# Patient Record
Sex: Female | Born: 1990 | Race: White | Hispanic: No | Marital: Married | Smoking: Never smoker
Health system: Southern US, Community
[De-identification: ages and names within clinical notes are randomized; demographics above are authoritative.]

## PROBLEM LIST (undated history)

## (undated) DIAGNOSIS — Z789 Other specified health status: Secondary | ICD-10-CM

## (undated) DIAGNOSIS — E282 Polycystic ovarian syndrome: Secondary | ICD-10-CM

## (undated) HISTORY — PX: NO PAST SURGERIES: SHX2092

## (undated) HISTORY — DX: Polycystic ovarian syndrome: E28.2

## (undated) HISTORY — DX: Other specified health status: Z78.9

---

## 2020-01-27 ENCOUNTER — Ambulatory Visit: Payer: 59

## 2020-01-27 ENCOUNTER — Other Ambulatory Visit: Payer: Self-pay | Admitting: Obstetrics and Gynecology

## 2020-01-27 ENCOUNTER — Other Ambulatory Visit: Payer: Self-pay

## 2020-01-27 ENCOUNTER — Other Ambulatory Visit: Payer: Self-pay | Admitting: *Deleted

## 2020-01-27 ENCOUNTER — Ambulatory Visit: Payer: 59 | Attending: Obstetrics and Gynecology

## 2020-01-27 ENCOUNTER — Ambulatory Visit: Payer: 59 | Admitting: *Deleted

## 2020-01-27 VITALS — BP 133/91 | HR 121 | Ht 65.0 in

## 2020-01-27 DIAGNOSIS — O3622X2 Maternal care for hydrops fetalis, second trimester, fetus 2: Secondary | ICD-10-CM | POA: Diagnosis present

## 2020-01-27 DIAGNOSIS — Z363 Encounter for antenatal screening for malformations: Secondary | ICD-10-CM

## 2020-01-27 DIAGNOSIS — O321XX0 Maternal care for breech presentation, not applicable or unspecified: Secondary | ICD-10-CM | POA: Diagnosis not present

## 2020-01-27 DIAGNOSIS — O30032 Twin pregnancy, monochorionic/diamniotic, second trimester: Secondary | ICD-10-CM | POA: Diagnosis present

## 2020-01-27 DIAGNOSIS — O3110X0 Continuing pregnancy after spontaneous abortion of one fetus or more, unspecified trimester, not applicable or unspecified: Secondary | ICD-10-CM

## 2020-01-27 DIAGNOSIS — O30002 Twin pregnancy, unspecified number of placenta and unspecified number of amniotic sacs, second trimester: Secondary | ICD-10-CM | POA: Diagnosis present

## 2020-01-27 DIAGNOSIS — O30009 Twin pregnancy, unspecified number of placenta and unspecified number of amniotic sacs, unspecified trimester: Secondary | ICD-10-CM | POA: Diagnosis present

## 2020-01-27 DIAGNOSIS — O30039 Twin pregnancy, monochorionic/diamniotic, unspecified trimester: Secondary | ICD-10-CM | POA: Insufficient documentation

## 2020-01-27 DIAGNOSIS — O0990 Supervision of high risk pregnancy, unspecified, unspecified trimester: Secondary | ICD-10-CM | POA: Insufficient documentation

## 2020-01-27 DIAGNOSIS — O3622X0 Maternal care for hydrops fetalis, second trimester, not applicable or unspecified: Secondary | ICD-10-CM

## 2020-01-27 DIAGNOSIS — Z3A20 20 weeks gestation of pregnancy: Secondary | ICD-10-CM | POA: Diagnosis not present

## 2020-01-27 NOTE — Progress Notes (Unsigned)
Maternal-Fetal Medicine  Name: Dana Lloyd MRN: [id number] Referring Provider: Eula Flax, MD  Dana Lloyd, G1 P0 at 20w 6d gestation with monochorionic-diamniotic twin pregnancy is here for a second opinion ultrasound.  On your office scan yesterday, fetal demise of 1 twin and hydrops fetalis in the surviving twin was seen.  Fetal anatomy scan performed on 01/16/2020 at your office showed normal-appearing fetuses with normal anatomy.  Chorionicity was confirmed on early ultrasound at your office.  Patient does not give history of fever or rashes. She does not have any chronic medical conditions. Past surgical history: Nil of note. Social history: Denies tobacco or drug or alcohol use.  She is married and her husband is in good health. Obstetric history: Patient denies history of miscarriages. Patient's blood type: O positive.  Ultrasound: Twin A: Maternal left, breech presentation, anterior placenta.  Fetal biometry is consistent with her previously established dates.  Amniotic fluid is normal.  Unfortunately, fetal heart activity was absent.  Generalized fetal edema is seen. No gross fetal anomalies are seen. Twin B: Maternal right, cephalic presentation, anterior placenta.  Fetal biometry is consistent with the previously established dates.  Amniotic fluid is normal and good fetal activity seen.  Significant findings include: -Generalized skin edema. -Fetal ascites (subdiaphragmatic) and minimal pleural and pericardial effusions. -Fetal anatomy that could otherwise be ascertained appears normal. -Middle cerebral artery Doppler was performed to rule out anemia.  Doppler study showed normal peak systolic velocity measurements (no evidence of anemia. -Placenta appears normal and not enlarged. Impression: Monochorionic-diamniotic twin pregnancy with fetal demise of one twin and hydrops fetalis in the surviving twin. Our concerns include: Hydrops fetalis: -It is accumulation of excess fluid  in one or more fetal compartments. -It has several causes including fetal infections, chromosomal anomalies, congenital malformations (including cardiac), twin-to-twin transfusion syndrome, fetal tumors, fetal anemia. -Exact cause may be difficult to determine in some cases; exclusion of some conditions are possible. -Twin-to-transfusion syndrome (TTTS) is unlikely given that normal amniotic fluid is seen in both sacs. Ultrasound performed at your office did not show evidence of TTTS. -Fetal infection is possible and I discussed screening for Parvovirus B 19, CMV and Toxoplasmosis infections. Amniotic fluid can also be analyzed for CMV and Toxoplasmosis PCR. -Early hydrops is associated with more-commonly with fetal chromosomal anomalies. I recommended amniocentesis to rule out fetal karyotype. Microarray analysis can be performed to rule out some (not all) genetic conditions.  -We will set up appointments for fetal echocardiography in future to rule out congenital heart malformations. -No obvious chest mass or tumors are seen. -Normal MCA Doppler study is reassuring, and fetal anemia is unlikely (more common with Parvovirus B19 infection). However, we will repeat MCA Doppler studies next week. -Metabolic or genetic causes are possible and amniocentesis may be able to determine some of these disorders. -Placenta appears normal (no evidence of chorioangioma that can lead to hydrops).  I informed the couple that hydrops fetalis is associated with a very high fetal mortality and is related to its cause.   Monochorionic-diamniotic twin pregnancy with single twin demise: -Fetal death of one twin in monochorionic-diamniotic twin can lead to profound hypotension in the surviving fetus. -I informed the couple that there was no evidence of overt TTTS or fetal growth restriction or fetal anomalies (twin A). -Neurological injuries follow in the surviving twin because of hypoxia (ischemic changes) or  hemorrhage. -In about 15% (1 in 6) of the cases, the co-twin dies and in about 25% (1 in 4), the  surviving twin has severe neurological complications. They can be poor neurodevelopmental outcome, seizures or cerebral palsy. These complications are more frequent if fetal death occurs in the third trimester. -Fetal brain MRI after few weeks may show signs of neurological lesions.  After counseling, the patient opted to have blood drawn to screen for infections. She would like to wait for a week and may have amniocentesis at her next visit. She understands that no intervention is possible now and there is a high likelihood of fetal death of surviving twin. Patient informed that termination of pregnancy is not an option and will continue her pregnancy regardless of fetal outcome.  Recommendations: -Blood was drawn for Parvovirus IgG/IgM, CMV IgG/IgM, Toxoplasmosis IgG/IgM. -Patient was counseled that CMV IgG avidity (if IgM is positive) may be necessary and toxoplasmosis screening may be performed at a reference lab if IgM is positive. -Appointment was made for her to return next week to evaluate the fetus and for possible amniocentesis. Thank you for ultrasound. Consultation including face-to-face counseling: 45 minutes.

## 2020-01-28 LAB — CMV ANTIBODY, IGG (EIA): CMV Ab - IgG: <0.6 U/mL (ref 0.00–0.59)

## 2020-01-28 LAB — PARVOVIRUS B19 ANTIBODY, IGG AND IGM
Parvovirus B19 IgG: 5.8 index — ABNORMAL HIGH (ref 0.0–0.8)
Parvovirus B19 IgM: 0.1 index (ref 0.0–0.8)

## 2020-01-28 LAB — TOXOPLASMA GONDII ANTIBODY, IGG: Toxoplasma IgG Ratio: <3 IU/mL (ref 0.0–7.1)

## 2020-01-28 LAB — INFECT DISEASE AB IGM REFLEX 1

## 2020-01-28 LAB — TOXOPLASMA GONDII ANTIBODY, IGM: Toxoplasma Antibody- IgM: <3 AU/mL (ref 0.0–7.9)

## 2020-01-28 LAB — CMV IGM: CMV IgM Ser EIA-aCnc: <30 AU/mL (ref 0.0–29.9)

## 2020-01-29 ENCOUNTER — Telehealth: Payer: Self-pay | Admitting: Obstetrics and Gynecology

## 2020-01-29 NOTE — Telephone Encounter (Signed)
Called patient and informed her of results. CMV, Toxo, and Parvovirus B19 screening all negative and no signs of active infection. Reassured the patient.

## 2020-02-03 ENCOUNTER — Other Ambulatory Visit: Payer: Self-pay | Admitting: Obstetrics and Gynecology

## 2020-02-04 ENCOUNTER — Ambulatory Visit: Payer: 59

## 2020-02-04 ENCOUNTER — Ambulatory Visit: Payer: 59 | Admitting: *Deleted

## 2020-02-04 ENCOUNTER — Other Ambulatory Visit: Payer: Self-pay | Admitting: *Deleted

## 2020-02-04 ENCOUNTER — Ambulatory Visit: Payer: 59 | Attending: Obstetrics and Gynecology

## 2020-02-04 ENCOUNTER — Other Ambulatory Visit: Payer: Self-pay

## 2020-02-04 VITALS — BP 127/89 | HR 104

## 2020-02-04 DIAGNOSIS — O30032 Twin pregnancy, monochorionic/diamniotic, second trimester: Secondary | ICD-10-CM

## 2020-02-04 DIAGNOSIS — O3112X0 Continuing pregnancy after spontaneous abortion of one fetus or more, second trimester, not applicable or unspecified: Secondary | ICD-10-CM | POA: Diagnosis not present

## 2020-02-04 DIAGNOSIS — Z3A22 22 weeks gestation of pregnancy: Secondary | ICD-10-CM

## 2020-02-04 DIAGNOSIS — O30002 Twin pregnancy, unspecified number of placenta and unspecified number of amniotic sacs, second trimester: Secondary | ICD-10-CM

## 2020-02-04 DIAGNOSIS — O3622X0 Maternal care for hydrops fetalis, second trimester, not applicable or unspecified: Secondary | ICD-10-CM

## 2020-02-04 DIAGNOSIS — O0990 Supervision of high risk pregnancy, unspecified, unspecified trimester: Secondary | ICD-10-CM | POA: Insufficient documentation

## 2020-02-04 DIAGNOSIS — IMO0002 Reserved for concepts with insufficient information to code with codable children: Secondary | ICD-10-CM

## 2020-02-13 ENCOUNTER — Ambulatory Visit: Payer: 59 | Attending: Obstetrics and Gynecology

## 2020-02-13 ENCOUNTER — Other Ambulatory Visit: Payer: Self-pay

## 2020-02-13 ENCOUNTER — Ambulatory Visit: Payer: 59 | Admitting: *Deleted

## 2020-02-13 VITALS — BP 127/83 | HR 98

## 2020-02-13 DIAGNOSIS — IMO0002 Reserved for concepts with insufficient information to code with codable children: Secondary | ICD-10-CM

## 2020-02-13 DIAGNOSIS — O0990 Supervision of high risk pregnancy, unspecified, unspecified trimester: Secondary | ICD-10-CM

## 2020-02-13 DIAGNOSIS — O3506X0 Maternal care for (suspected) central nervous system malformation or damage in fetus, hydrocephaly, not applicable or unspecified: Secondary | ICD-10-CM

## 2020-02-13 DIAGNOSIS — O3112X0 Continuing pregnancy after spontaneous abortion of one fetus or more, second trimester, not applicable or unspecified: Secondary | ICD-10-CM | POA: Diagnosis not present

## 2020-02-13 DIAGNOSIS — O30032 Twin pregnancy, monochorionic/diamniotic, second trimester: Secondary | ICD-10-CM

## 2020-02-13 DIAGNOSIS — O3622X0 Maternal care for hydrops fetalis, second trimester, not applicable or unspecified: Secondary | ICD-10-CM

## 2020-02-13 DIAGNOSIS — Z3A23 23 weeks gestation of pregnancy: Secondary | ICD-10-CM | POA: Diagnosis not present

## 2020-02-13 DIAGNOSIS — O350XX0 Maternal care for (suspected) central nervous system malformation in fetus, not applicable or unspecified: Secondary | ICD-10-CM | POA: Insufficient documentation

## 2020-02-17 ENCOUNTER — Other Ambulatory Visit: Payer: Self-pay

## 2020-02-17 ENCOUNTER — Telehealth: Payer: Self-pay | Admitting: Genetic Counselor

## 2020-02-17 NOTE — Telephone Encounter (Addendum)
I called Dana Lloyd to discuss results from Dana Lloyd Panorama noninvasive prenatal screening (NIPS). Dana Lloyd had Panorama NIPS performed to assess for chromosomal aneuploidies given ventriculomegaly in the fetus remaining from Dana Lloyd twin pregnancy. We reviewed that these results showed a less than 1 in 10,000 risk for trisomies 21, 18 and 13. In addition, the risk for triploidy was also low. However, an atypical finding on the sex chromosomes was identified. The laboratory was unable to determine if this finding was suspected to be maternal or fetal (placental) in nature. A genetic counselor from [place] informed me that this change is suspected to involve the X chromosome.  We reviewed that NIPS analyzes cell-free fetal DNA from the placenta found in the maternal bloodstream during pregnancy. Based on this, we discussed that there are several possibilities that could warrant Dana Lloyd atypical NIPS result. One possibility is the fetus having a genetic change involving the sex chromosomes. Another possibility is Dana Lloyd herself having a genetic change involving Dana Lloyd sex chromosomes. A second possibility is fetal or maternal mosaicism for a change involving the sex chromosomes. Mosaicism occurs when not every cell in the body is genetically identical; some cells in the body may have a genetic change involving the sex chromosomes, whereas others may have normal sex chromosomes. A third possibility is that of confined placental mosaicism, or a result representative of the placenta only rather than the fetus. Finally, it is possible that this is a false positive result. Since the twins were monochorionic-diamniotic, they would have been monozygotic; thus, if this finding is truly fetal in origin, it would likely be representative of both twins. A false positive result representing only the demised twin is unlikely.  We reviewed available testing options to attempt to get more information to clarify this finding.  Firstly, Ms.  Lloyd was informed that diagnostic testing via amniocentesis would be the only way to determine if the fetus has a chromosomal abnormality involving the sex chromosomes prenatally. She was also made aware that she has the option to wait to pursue genetic testing postnatally if desired. Secondly, we could consider pursuing a maternal karyotype to determine if Dana Lloyd herself has a genetic change involving Dana Lloyd sex chromosomes. Dana Lloyd had previously discussed the option of amniocentesis with Dana Lloyd and indicated that she was not interested in pursuing this option. The risks associated with this procedure are anxiety-producing to Dana Lloyd, especially given that she has already had the demise of one twin during this pregnancy. She would like to pursue testing postnatally. Dana Lloyd is also interested in undergoing karyotype analysis for herself if Dana Lloyd insurance company will cover it. We made a plan for me to email Dana Lloyd the CPT billing codes associated with karyotype. She can call Dana Lloyd insurance provider with these codes to determine whether they will cover the cost of this testing.   Dana Lloyd had excellent questions regarding what the implications would be if the fetus does have a genetic change involving his sex chromosomes. We discussed that without knowing the precise reason for this abnormal NIPS result, we are limited in understanding if there may be any potential health or developmental effects for the fetus. If the fetus were to have a chromosomal abnormality, the size, location, and type of chromosomal change would all be important in determining possible effects on health/development. We discussed that some chromosomal changes may have implications for health/development, such as impacts on puberty and/or learning, whereas other chromosomal changes may be benign familial findings. If postnatal testing  were to confirm an abnormal chromosomal finding, Dana Lloyd would be informed about which, if any, features  she could expect in Dana Lloyd son as a result.  Dana Lloyd will contact me once she has determined if Dana Lloyd insurance provider will cover the cost of karyotype analysis for herself. I will help to facilitate testing from there if still desired. She confirmed that she had no further questions at this time.  Gershon Crane, MS, [facility] Genetic Counselor

## 2020-02-20 ENCOUNTER — Ambulatory Visit: Payer: 59

## 2020-02-27 ENCOUNTER — Ambulatory Visit: Payer: 59

## 2020-02-27 ENCOUNTER — Other Ambulatory Visit: Payer: Self-pay

## 2020-02-27 ENCOUNTER — Ambulatory Visit: Payer: 59 | Attending: Obstetrics and Gynecology

## 2020-02-27 ENCOUNTER — Ambulatory Visit: Payer: 59 | Admitting: *Deleted

## 2020-02-27 VITALS — BP 134/90 | HR 106

## 2020-02-27 DIAGNOSIS — O3622X0 Maternal care for hydrops fetalis, second trimester, not applicable or unspecified: Secondary | ICD-10-CM | POA: Diagnosis not present

## 2020-02-27 DIAGNOSIS — O285 Abnormal chromosomal and genetic finding on antenatal screening of mother: Secondary | ICD-10-CM

## 2020-02-27 DIAGNOSIS — O3122X0 Continuing pregnancy after intrauterine death of one fetus or more, second trimester, not applicable or unspecified: Secondary | ICD-10-CM

## 2020-02-27 DIAGNOSIS — O30032 Twin pregnancy, monochorionic/diamniotic, second trimester: Secondary | ICD-10-CM | POA: Diagnosis not present

## 2020-02-27 DIAGNOSIS — Z3A25 25 weeks gestation of pregnancy: Secondary | ICD-10-CM

## 2020-02-27 DIAGNOSIS — O350XX0 Maternal care for (suspected) central nervous system malformation in fetus, not applicable or unspecified: Secondary | ICD-10-CM | POA: Diagnosis not present

## 2020-02-27 DIAGNOSIS — IMO0002 Reserved for concepts with insufficient information to code with codable children: Secondary | ICD-10-CM

## 2020-02-27 DIAGNOSIS — O0990 Supervision of high risk pregnancy, unspecified, unspecified trimester: Secondary | ICD-10-CM

## 2020-02-27 DIAGNOSIS — O30002 Twin pregnancy, unspecified number of placenta and unspecified number of amniotic sacs, second trimester: Secondary | ICD-10-CM | POA: Diagnosis not present

## 2020-03-01 ENCOUNTER — Other Ambulatory Visit: Payer: Self-pay | Admitting: *Deleted

## 2020-03-05 ENCOUNTER — Ambulatory Visit: Payer: 59

## 2020-03-08 ENCOUNTER — Encounter: Payer: Self-pay | Admitting: Pediatric Cardiology

## 2020-03-11 ENCOUNTER — Telehealth: Payer: Self-pay | Admitting: Genetic Counselor

## 2020-03-11 LAB — CHROMOSOME, BLOOD, ROUTINE
Cells Analyzed: 20
Cells Counted: 20
Cells Karyotyped: 2
GTG Band Resolution Achieved: 500

## 2020-03-11 NOTE — Telephone Encounter (Signed)
LVM for Dana Lloyd informing her that I have results from her karyotype. Requested a call back to discuss these in detail.  Gershon Crane, MS, [facility] Genetic Counselor

## 2020-03-12 ENCOUNTER — Ambulatory Visit: Payer: Self-pay | Admitting: Genetic Counselor

## 2020-03-12 ENCOUNTER — Other Ambulatory Visit: Payer: Self-pay

## 2020-03-12 ENCOUNTER — Ambulatory Visit: Payer: 59 | Attending: Obstetrics and Gynecology

## 2020-03-12 ENCOUNTER — Ambulatory Visit: Payer: 59 | Admitting: *Deleted

## 2020-03-12 VITALS — BP 127/81 | HR 101

## 2020-03-12 DIAGNOSIS — Z315 Encounter for genetic counseling: Secondary | ICD-10-CM

## 2020-03-12 DIAGNOSIS — Z3A27 27 weeks gestation of pregnancy: Secondary | ICD-10-CM

## 2020-03-12 DIAGNOSIS — O3622X0 Maternal care for hydrops fetalis, second trimester, not applicable or unspecified: Secondary | ICD-10-CM

## 2020-03-12 DIAGNOSIS — O0990 Supervision of high risk pregnancy, unspecified, unspecified trimester: Secondary | ICD-10-CM | POA: Diagnosis present

## 2020-03-12 DIAGNOSIS — O30032 Twin pregnancy, monochorionic/diamniotic, second trimester: Secondary | ICD-10-CM | POA: Diagnosis not present

## 2020-03-12 DIAGNOSIS — O30002 Twin pregnancy, unspecified number of placenta and unspecified number of amniotic sacs, second trimester: Secondary | ICD-10-CM

## 2020-03-12 DIAGNOSIS — O3112X0 Continuing pregnancy after spontaneous abortion of one fetus or more, second trimester, not applicable or unspecified: Secondary | ICD-10-CM

## 2020-03-12 DIAGNOSIS — O350XX0 Maternal care for (suspected) central nervous system malformation in fetus, not applicable or unspecified: Secondary | ICD-10-CM | POA: Insufficient documentation

## 2020-03-12 DIAGNOSIS — IMO0002 Reserved for concepts with insufficient information to code with codable children: Secondary | ICD-10-CM

## 2020-03-12 NOTE — Progress Notes (Signed)
I briefly met with Dana Lloyd and her husband today to review results from her karyotype. Dana Lloyd underwent karyotype analysis following noninvasive prenatal screening (NIPS) results that were high-risk for an atypical finding on the sex chromosomes. Dana Lloyd karyotype was normal (46,XX). This rules out some maternal sex chromosome abnormalities, such as mosaic Turner syndrome. However, these results cannot rule out a smaller maternal sex chromosome abnormality that would not be detectable via karyotype, such as a microdeletion or microduplication.  We again reviewed additional testing options available to further explore the etiology of Dana Lloyd's abnormal NIPS finding. Dana Lloyd confirmed that she is still not interested in undergoing amniocentesis to definitively determine if the fetus has a sex chromosome abnormality. We discussed that there is additional testing available to test for other maternal chromosome abnormalities, such as a chromosomal microarray. However, the utility of this testing may be limited, as it is possible that a finding may not provide information about Dana Lloyd's health or the health of the fetus. Dana Lloyd declined further testing. She was informed that postnatal testing is an option if clinically indicated.   Dana Lloyd and her husband confirmed that they had no further questions at this time. I encouraged her to contact me if she has any questions or concerns in the future.  Dana Manis, MS, [facility] Genetic Counselor

## 2020-03-19 ENCOUNTER — Telehealth (HOSPITAL_COMMUNITY): Payer: Self-pay | Admitting: Obstetrics and Gynecology

## 2020-03-19 NOTE — Telephone Encounter (Signed)
I called and spoke with Dana Lloyd. Discussed fetal brain MRI results. MRI showed loss of brain volume suggesting vascular injury.  Earlier, I received a call from Dr. Kirby Funk, MFM at [facility] to discuss results (MRI ordered by her).  I discussed with the patient the option of delivery at [facility] (recommended) to have pediatric neurology support after delivery.  Patient will discuss with her husband and decide and communicate on Monday.  If she agrees to deliver at [facility], we will set up an appointment with MFM, [facility].  I discussed with Dr. Ellyn Hack.

## 2020-03-29 ENCOUNTER — Other Ambulatory Visit: Payer: Self-pay

## 2020-03-29 ENCOUNTER — Ambulatory Visit: Payer: 59 | Admitting: *Deleted

## 2020-03-29 ENCOUNTER — Ambulatory Visit: Payer: 59 | Attending: Obstetrics and Gynecology

## 2020-03-29 ENCOUNTER — Other Ambulatory Visit: Payer: Self-pay | Admitting: *Deleted

## 2020-03-29 VITALS — BP 132/87 | HR 96

## 2020-03-29 DIAGNOSIS — Z362 Encounter for other antenatal screening follow-up: Secondary | ICD-10-CM

## 2020-03-29 DIAGNOSIS — O30032 Twin pregnancy, monochorionic/diamniotic, second trimester: Secondary | ICD-10-CM

## 2020-03-29 DIAGNOSIS — O0990 Supervision of high risk pregnancy, unspecified, unspecified trimester: Secondary | ICD-10-CM | POA: Insufficient documentation

## 2020-03-29 DIAGNOSIS — Z3A29 29 weeks gestation of pregnancy: Secondary | ICD-10-CM

## 2020-03-29 DIAGNOSIS — O359XX0 Maternal care for (suspected) fetal abnormality and damage, unspecified, not applicable or unspecified: Secondary | ICD-10-CM

## 2020-03-29 DIAGNOSIS — O350XX0 Maternal care for (suspected) central nervous system malformation in fetus, not applicable or unspecified: Secondary | ICD-10-CM

## 2020-03-29 DIAGNOSIS — O3622X0 Maternal care for hydrops fetalis, second trimester, not applicable or unspecified: Secondary | ICD-10-CM

## 2020-03-29 DIAGNOSIS — O3112X0 Continuing pregnancy after spontaneous abortion of one fetus or more, second trimester, not applicable or unspecified: Secondary | ICD-10-CM

## 2020-03-29 DIAGNOSIS — O30002 Twin pregnancy, unspecified number of placenta and unspecified number of amniotic sacs, second trimester: Secondary | ICD-10-CM

## 2020-04-12 ENCOUNTER — Ambulatory Visit: Payer: 59

## 2020-04-13 ENCOUNTER — Other Ambulatory Visit: Payer: Self-pay

## 2021-10-03 ENCOUNTER — Other Ambulatory Visit: Payer: Self-pay

## 2021-10-03 ENCOUNTER — Ambulatory Visit: Payer: 59 | Admitting: Neurology

## 2021-10-03 ENCOUNTER — Encounter: Payer: Self-pay | Admitting: Neurology

## 2021-10-03 VITALS — BP 118/80 | HR 91 | Ht 65.0 in | Wt 195.0 lb

## 2021-10-03 DIAGNOSIS — H4710 Unspecified papilledema: Secondary | ICD-10-CM

## 2021-10-03 DIAGNOSIS — G4489 Other headache syndrome: Secondary | ICD-10-CM

## 2021-10-03 NOTE — Progress Notes (Signed)
[facility]  PATIENT: Dana Lloyd DOB: October 14, 1990  REFERRING DOCTOR OR PCP: Daisy Lazar, DO SOURCE: Patient, notes from Dr. Charise Killian  _________________________________   HISTORICAL  CHIEF COMPLAINT:  Chief Complaint  Patient presents with   New Patient (Initial Visit)    RM 1. Paper referral for optic disc edema. Pt has intermittent headache (not daily)    HISTORY OF PRESENT ILLNESS: I had the pleasure of seeing your patient, Dana Lloyd, at [facility] for neurologic consultation regarding her bilateral optic disc edema.  She is a 31 year old woman who was found on routine optometric evaluation to have bilateral disc edema.  She has a recent optometric examination Marilu Favre, OD) from 09/10/2021.  The purpose of the visit was for blurred vision at a distance.  Vision was correctable to 20/20 and contact lenses were prescribed..  Pupils were normal.  Extraocular muscles were normal.  Slit-lamp examination was normal.  Funduscopic examination showed bilateral disc edema.  It was otherwise normal.   Her previous eye exam was about a year earlier.      She experiences headaches lasting 1-3 hours about every other day.   She does not have tinnitus.  No diplopia or change in vision with position.  No visual changes (better with new glasses).      Weight is stable over last year.  Her diet is normal.   She denies significant snoring.  She does not have excessive daytime sleepiness.  She has not had any imaging studies of the brain.  She delivered a son  05/20/2020.  She had an epidural for anesthesia but did have a CSF leak.   She did have a severe headache afterwards that did not change with positions.  She was treated with medications for the headache and conservative measures.  REVIEW OF SYSTEMS: Constitutional: No fevers, chills, sweats, or change in appetite Eyes: As above.   Ear, nose and throat: No hearing loss, ear pain, nasal congestion,  sore throat Cardiovascular: No chest pain, palpitations Respiratory:  No shortness of breath at rest or with exertion.   No wheezes GastrointestinaI: No nausea, vomiting, diarrhea, abdominal pain, fecal incontinence Genitourinary:  No dysuria, urinary retention or frequency.  No nocturia. Musculoskeletal:  No neck pain, back pain Integumentary: No rash, pruritus, skin lesions Neurological: as above Psychiatric: No depression at this time.  No anxiety Endocrine: No palpitations, diaphoresis, change in appetite, change in weigh or increased thirst Hematologic/Lymphatic:  No anemia, purpura, petechiae. Allergic/Immunologic: No itchy/runny eyes, nasal congestion, recent allergic reactions, rashes  ALLERGIES: No Known Allergies  HOME MEDICATIONS:  Current Outpatient Medications:    norethindrone (MICRONOR) 0.35 MG tablet, Take 1 tablet by mouth daily., Disp: , Rfl:    sertraline (ZOLOFT) 50 MG tablet, Take 50 mg by mouth daily., Disp: , Rfl:   PAST MEDICAL HISTORY: Past Medical History:  Diagnosis Date   Medical history non-contributory     PAST SURGICAL HISTORY: Past Surgical History:  Procedure Laterality Date   NO PAST SURGERIES      FAMILY HISTORY: History reviewed. No pertinent family history.  SOCIAL HISTORY:  Social History   Socioeconomic History   Marital status: Married    Spouse name: Not on file   Number of children: Not on file   Years of education: Not on file   Highest education level: Not on file  Occupational History   Not on file  Tobacco Use   Smoking status: Never   Smokeless tobacco: Never  Vaping  Use   Vaping Use: Never used  Substance and Sexual Activity   Alcohol use: Not Currently   Drug use: Never   Sexual activity: Not on file  Other Topics Concern   Not on file  Social History Narrative   Right handed   Coffee every other day   Social Determinants of Health   Financial Resource Strain: Not on file  Food Insecurity: Not on file   Transportation Needs: Not on file  Physical Activity: Not on file  Stress: Not on file  Social Connections: Not on file  Intimate Partner Violence: Not on file     PHYSICAL EXAM  Vitals:   10/03/21 0842  BP: 118/80  Height: 5\' 5"  (1.651 m)    There is no height or weight on file to calculate BMI.   General: The patient is well-developed and well-nourished and in no acute distress  HEENT:  Head is [place]/AT.  Sclera are anicteric.  Funduscopic exam shows optic nerve edema and normal retinal vessels.  Neck: No carotid bruits are noted.  The neck is nontender.  Cardiovascular: The heart has a regular rate and rhythm with a normal S1 and S2. There were no murmurs, gallops or rubs.    Skin: Extremities are without rash or  edema.  Musculoskeletal:  Back is nontender  Neurologic Exam  Mental status: The patient is alert and oriented x 3 at the time of the examination. The patient has apparent normal recent and remote memory, with an apparently normal attention span and concentration ability.   Speech is normal.  Cranial nerves: Extraocular movements are full. Pupils are equal, round, and reactive to light and accomodation.  Visual fields are full.  Facial symmetry is present. There is good facial sensation to soft touch bilaterally.Facial strength is normal.  Trapezius and sternocleidomastoid strength is normal. No dysarthria is noted.  The tongue is midline, and the patient has symmetric elevation of the soft palate. No obvious hearing deficits are noted.  Motor:  Muscle bulk is normal.   Tone is normal. Strength is  5 / 5 in all 4 extremities.   Sensory: Sensory testing is intact to pinprick, soft touch and vibration sensation in all 4 extremities.  Coordination: Cerebellar testing reveals good finger-nose-finger and heel-to-shin bilaterally.  Gait and station: Station is normal.   Gait is normal. Tandem gait is normal. Romberg is negative.   Reflexes: Deep tendon reflexes are  symmetric and normal in arms, 3 at knees and 2 at ankles..   No ankle clonus.    ASSESSMENT AND PLAN  Optic nerve edema - Plan: MR BRAIN W WO CONTRAST  Other headache syndrome - Plan: MR BRAIN W WO CONTRAST  In summary, Ms. Manson PasseyBrown is a 31 year old woman with bilateral optic nerve edema noted on routine optometric evaluation last month.  She does have some headaches that are nonspecific.  Of note, she did have a spinal fluid leak September 2021 after an epidural for childbirth and had headaches for several weeks afterwards that did not have a clear positional element.  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  However, we will need to check an MRI of the brain to make sure that there is not a pathologic explanation for her symptoms.  If the MRI is normal or near normal we will have her get a lumbar puncture to measure the opening pressure.  If elevated we will start Diamox.  Her main risk factor for IIH is elevated  BMI (32).  She will return to see me in 3 months or sooner if there are new or worsening neurologic symptoms or based on results of studies.  Thank you for asking to see Ms. Manson Passey.  Please let me know if I can be of further assistance with her or other patients in the future.   Laniesha Das A. Epimenio Foot, MD, [facility] 10/03/2021, 8:49 AM Certified in Neurology, Clinical Neurophysiology, Sleep Medicine and Neuroimaging  [facility] [address], [address] [place], [place] [address] [phone]

## 2021-10-04 ENCOUNTER — Other Ambulatory Visit: Payer: Self-pay | Admitting: Neurology

## 2021-10-04 NOTE — Telephone Encounter (Signed)
spoke to the patient due to the cost she will get back to me  [facility] auth: [id number] (exp. 10/04/21 to 11/18/21)

## 2021-10-05 MED ORDER — ALPRAZOLAM 0.5 MG PO TABS: 0.5000 mg | ORAL_TABLET | Freq: Once | ORAL | 0 refills | Status: DC | PRN

## 2021-10-05 NOTE — Addendum Note (Signed)
Addended by: Darleen Crocker on: 10/05/2021 02:22 PM   Modules accepted: Orders

## 2021-10-05 NOTE — Telephone Encounter (Signed)
Patient called back and wanted to schedule her MRI. She is scheduled at [facility] for 10/11/21.  She also informed me she is claustrophobic and would like something to help her. She is aware to have a driver.

## 2021-10-11 ENCOUNTER — Other Ambulatory Visit: Payer: Self-pay | Admitting: Neurology

## 2021-10-11 ENCOUNTER — Ambulatory Visit: Payer: 59

## 2021-10-11 DIAGNOSIS — G4489 Other headache syndrome: Secondary | ICD-10-CM | POA: Diagnosis not present

## 2021-10-11 DIAGNOSIS — H4710 Unspecified papilledema: Secondary | ICD-10-CM

## 2021-10-11 MED ORDER — GADOBENATE DIMEGLUMINE 529 MG/ML IV SOLN
15.0000 mL | Freq: Once | INTRAVENOUS | Status: AC | PRN
  Administered 2021-10-11: 15 mL via INTRAVENOUS

## 2021-10-12 ENCOUNTER — Encounter: Payer: Self-pay | Admitting: Neurology

## 2021-10-15 ENCOUNTER — Encounter: Payer: Self-pay | Admitting: Neurology

## 2021-10-20 ENCOUNTER — Telehealth: Payer: Self-pay | Admitting: *Deleted

## 2021-10-20 ENCOUNTER — Telehealth: Payer: Self-pay | Admitting: Neurology

## 2021-10-20 ENCOUNTER — Ambulatory Visit
Admission: RE | Admit: 2021-10-20 | Discharge: 2021-10-20 | Disposition: A | Discharge status: Home / Self Care | Payer: 59 | Source: Ambulatory Visit | Attending: Neurology | Admitting: Neurology

## 2021-10-20 ENCOUNTER — Other Ambulatory Visit: Payer: Self-pay | Admitting: Neurology

## 2021-10-20 VITALS — BP 109/67 | HR 96

## 2021-10-20 DIAGNOSIS — H4710 Unspecified papilledema: Secondary | ICD-10-CM

## 2021-10-20 DIAGNOSIS — G4489 Other headache syndrome: Secondary | ICD-10-CM

## 2021-10-20 NOTE — Telephone Encounter (Signed)
-----   Message from Asa Lente, MD sent at 10/20/2021  3:35 PM EST ----- Please let her know that the lumbar puncture showed that the spinal fluid pressure was actually normal.  Therefore, I do not have an explanation for the changes in the optic nerves.  I would like her to see a neuro-ophthalmologist (Dr. Gentry Roch or one of his colleagues at [facility]) as there to get their input into what might be causing this.

## 2021-10-20 NOTE — Telephone Encounter (Signed)
Referral sent to [facility] (Dr. Gentry Roch) [phone].

## 2021-10-20 NOTE — Discharge Instructions (Signed)

## 2021-10-25 LAB — VDRL, CSF: VDRL Quant, CSF: NONREACTIVE

## 2021-10-25 LAB — CSF CELL COUNT WITH DIFFERENTIAL
RBC Count, CSF: 0 cells/uL
WBC, CSF: 0 cells/uL (ref 0–5)

## 2021-10-25 LAB — GLUCOSE, CSF: Glucose, CSF: 58 mg/dL (ref 40–80)

## 2021-10-25 LAB — PROTEIN, CSF: Total Protein, CSF: 34 mg/dL (ref 15–45)

## 2021-11-02 ENCOUNTER — Other Ambulatory Visit: Payer: Self-pay | Admitting: *Deleted

## 2021-11-02 DIAGNOSIS — H4710 Unspecified papilledema: Secondary | ICD-10-CM

## 2021-11-02 DIAGNOSIS — G4489 Other headache syndrome: Secondary | ICD-10-CM

## 2021-11-02 NOTE — Telephone Encounter (Signed)
Sent via their [facility].

## 2021-11-03 NOTE — Telephone Encounter (Signed)
Sent to Neuro-ophthalmology at [facility].  Ph # [phone]

## 2021-11-18 ENCOUNTER — Other Ambulatory Visit: Payer: Self-pay

## 2021-11-18 ENCOUNTER — Encounter: Payer: Self-pay | Admitting: Nurse Practitioner

## 2021-11-18 ENCOUNTER — Ambulatory Visit: Payer: 59 | Admitting: Nurse Practitioner

## 2021-11-18 VITALS — BP 132/85 | HR 99 | Temp 98.6°F | Ht 65.7 in | Wt 200.0 lb

## 2021-11-18 DIAGNOSIS — Z7689 Persons encountering health services in other specified circumstances: Secondary | ICD-10-CM

## 2021-11-18 DIAGNOSIS — H4710 Unspecified papilledema: Secondary | ICD-10-CM

## 2021-11-18 DIAGNOSIS — F339 Major depressive disorder, recurrent, unspecified: Secondary | ICD-10-CM

## 2021-11-18 MED ORDER — SERTRALINE HCL 50 MG PO TABS: 50.0000 mg | ORAL_TABLET | Freq: Every day | ORAL | 1 refills | Status: AC

## 2021-11-18 NOTE — Assessment & Plan Note (Signed)
Chronic. Ongoing.  Found on routine eye exam.  Followed by Dr. Epimenio Foot Neurology.  Continue to follow per their recommendations. ?

## 2021-11-18 NOTE — Assessment & Plan Note (Signed)
Chronic.  Controlled.  Continue with current medication regimen of Zoloft 50mg daily.  Refills sent today.  Labs ordered today.  Return to clinic in 6 months for reevaluation.  Call sooner if concerns arise.   

## 2021-11-18 NOTE — Progress Notes (Signed)
? ?BP 132/85   Pulse 99   Temp 98.6 ?F (37 ?C) (Oral)   Ht 5' 5.7" (1.669 m)   Wt 200 lb (90.7 kg)   LMP 11/08/2021 (Exact Date)   SpO2 99%   BMI 32.58 kg/m?   ? ?Subjective:  ? ? Patient ID: Dana Lloyd, female    DOB: 1990-10-11, 31 y.o.   MRN: [id number] ? ?HPI: ?Dana Lloyd is a 31 y.o. female ? ?Chief Complaint  ?Patient presents with  ? New Patient (Initial Visit)  ?  No concerns per patient  ? ? ?Patient presents to clinic to establish care with new PCP.  Introduced to Designer, jewellery role and practice setting.  All questions answered.  Discussed provider/patient relationship and expectations.  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  Denies any past surgical history.  ? ? ?Patient denies a history of: Hypertension, Elevated Cholesterol, Diabetes, Thyroid problems, Depression, Anxiety, Neurological problems, and Abdominal problems.  ? ? ?DEPRESSION/ANXIETY ?Patient states she feels like she is doing well with the Zoloft.  States that some months are better than others.  She feels like the 50mg  of Zoloft is a good dose.  Denies concerns at visit today.  ? ?[place] Office Visit from 11/18/2021 in [place]  ?PHQ-9 Total Score 7  ? ?  ? ? ? ? ? ?Active Ambulatory Problems  ?  Diagnosis Date Noted  ? 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 Other headache syndrome 10/03/2021  ? Depression, recurrent ([place]) 11/18/2021  ? ?Resolved Ambulatory Problems  ?  Diagnosis Date Noted  ? No Resolved Ambulatory Problems  ? ?Past Medical History:  ?Diagnosis Date  ? Medical history non-contributory   ? ?Past Surgical History:  ?Procedure Laterality Date  ? NO PAST SURGERIES    ? ?Family History  ?Problem Relation Age of Onset  ? Hypertension Mother   ? Hypertension Father   ? Cerebral palsy Son   ? GER disease Son   ? Microcephaly Son   ? Epilepsy Son   ? ? ? ?Review of Systems  ?Psychiatric/Behavioral:  Positive for dysphoric mood. Negative for suicidal  ideas. The patient is nervous/anxious.   ? ?Per HPI unless specifically indicated above ? ?   ?Objective:  ?  ?BP 132/85   Pulse 99   Temp 98.6 ?F (37 ?C) (Oral)   Ht 5' 5.7" (1.669 m)   Wt 200 lb (90.7 kg)   LMP 11/08/2021 (Exact Date)   SpO2 99%   BMI 32.58 kg/m?   ?Wt Readings from Last 3 Encounters:  ?11/18/21 200 lb (90.7 kg)  ?10/03/21 195 lb (88.5 kg)  ?  ?Physical Exam ?Vitals and nursing note reviewed.  ?Constitutional:   ?   General: She is not in acute distress. ?   Appearance: Normal appearance. She is normal weight. She is not ill-appearing, toxic-appearing or diaphoretic.  ?HENT:  ?   Head: Normocephalic.  ?   Right Ear: External ear normal.  ?   Left Ear: External ear normal.  ?   Nose: Nose normal.  ?   Mouth/Throat:  ?   Mouth: Mucous membranes are moist.  ?   Pharynx: Oropharynx is clear.  ?Eyes:  ?   General:     ?   Right eye: No discharge.     ?   Left eye: No discharge.  ?   Extraocular Movements: Extraocular movements intact.  ?   Conjunctiva/sclera: Conjunctivae normal.  ?  Pupils: Pupils are equal, round, and reactive to light.  ?Cardiovascular:  ?   Rate and Rhythm: Normal rate and regular rhythm.  ?   Heart sounds: No murmur heard. ?Pulmonary:  ?   Effort: Pulmonary effort is normal. No respiratory distress.  ?   Breath sounds: Normal breath sounds. No wheezing or rales.  ?Musculoskeletal:  ?   Cervical back: Normal range of motion and neck supple.  ?Skin: ?   General: Skin is warm and dry.  ?   Capillary Refill: Capillary refill takes less than 2 seconds.  ?Neurological:  ?   General: No focal deficit present.  ?   Mental Status: She is alert and oriented to person, place, and time. Mental status is at baseline.  ?Psychiatric:     ?   Mood and Affect: Mood normal.     ?   Behavior: Behavior normal.     ?   Thought Content: Thought content normal.     ?   Judgment: Judgment normal.  ? ? ?Results for orders placed or performed during the hospital encounter of 10/20/21  ?Glucose, CSF   ?Result Value Ref Range  ? Glucose, CSF 58 40 - 80 mg/dL  ?CSF cell count with differential  ?Result Value Ref Range  ? Color, CSF COLORLESS COLORLESS  ? Appearance, CSF CLEAR CLEAR  ? RBC Count, CSF 0 0 cells/uL  ? WBC, CSF 0 0 - 5 cells/uL  ? Segmented Neutrophils-CSF CANCELED   ?Protein, CSF  ?Result Value Ref Range  ? Total Protein, CSF 34 15 - 45 mg/dL  ?VDRL, CSF  ?Result Value Ref Range  ? VDRL Quant, CSF Nonreactive Nonreactive  ? ?   ?Assessment & Plan:  ? ?Problem List Items Addressed This Visit   ? ?  ? Other  ? 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  Found on routine eye exam.  Followed by Dr. Felecia Shelling [facility].  Continue to follow per their recommendations. ?  ?  ? Depression, recurrent ([place]) - Primary  ?  Chronic.  Controlled.  Continue with current medication regimen of Zoloft 50mg  daily.  Refills sent today.  Labs ordered today.  Return to clinic in 6 months for reevaluation.  Call sooner if concerns arise.  ? ?  ?  ? Relevant Medications  ? sertraline (ZOLOFT) 50 MG tablet  ? ?Other Visit Diagnoses   ? ? Encounter to establish care      ? Return in 1 month for Physical and fasting labs.  ? ?  ?  ? ?Follow up plan: ?Return in about 1 month (around 12/19/2021) for Physical and Fasting labs. ? ? ? ? ? ?

## 2021-12-21 ENCOUNTER — Other Ambulatory Visit: Payer: Self-pay | Admitting: Nurse Practitioner

## 2021-12-21 ENCOUNTER — Other Ambulatory Visit (HOSPITAL_COMMUNITY): Payer: Self-pay

## 2021-12-21 MED ORDER — NORETHINDRONE 0.35 MG PO TABS
1.0000 | ORAL_TABLET | Freq: Every day | ORAL | 0 refills | Status: DC
  Filled 2021-12-21: qty 28, 28d supply, fill #0

## 2021-12-21 NOTE — Telephone Encounter (Signed)
Med refill request for norethindrone 0.35MG , last OV 11/18/21, scheduled office visit 01/17/22. Please advise.  ?

## 2021-12-24 ENCOUNTER — Encounter: Payer: Self-pay | Admitting: Nurse Practitioner

## 2021-12-26 ENCOUNTER — Other Ambulatory Visit: Payer: Self-pay

## 2021-12-26 ENCOUNTER — Other Ambulatory Visit: Payer: Self-pay | Admitting: Nurse Practitioner

## 2021-12-26 MED ORDER — NORETHINDRONE 0.35 MG PO TABS: 1.0000 | ORAL_TABLET | Freq: Every day | ORAL | 0 refills | Status: DC

## 2021-12-27 NOTE — Telephone Encounter (Signed)
Requested medication (s) are due for refill today:   Prescribed yesterday 4/17 ? ?Requested medication (s) are on the active medication list:   Yes ? ?Future visit scheduled:   Yes in 3 wks with Clydie Braun ? ? ?Last ordered: 12/26/2021 #28, 0 refills ? ?Returned because a 90 day supply is being requested.  ? ?Requested Prescriptions  ?Pending Prescriptions Disp Refills  ? norethindrone (MICRONOR) 0.35 MG tablet [Pharmacy Med Name: NORETHINDRONE 0.35MG  TABLETS 28S] 84 tablet   ?  Sig: TAKE 1 TABLET(0.35 MG) BY MOUTH DAILY  ?  ? OB/GYN: Contraceptives - Progestins Passed - 12/26/2021  2:05 PM  ?  ?  Passed - Last BP in normal range  ?  BP Readings from Last 1 Encounters:  ?11/18/21 132/85  ?  ?  ?  ?  Passed - Valid encounter within last 12 months  ?  Recent Outpatient Visits   ? ?      ? 1 month ago Depression, recurrent (HCC)  ? [facility] Larae Grooms, NP  ? ?  ?  ?Future Appointments   ? ?        ? In 3 weeks Larae Grooms, NP [facility]  ? ?  ? ? ?  ?  ?  Passed - Patient is not a smoker  ?  ?  ? ?

## 2021-12-27 NOTE — Telephone Encounter (Signed)
Patient pharmacy is requesting patient to have 90-day supply sent over. Please advise? ?

## 2021-12-29 ENCOUNTER — Encounter: Payer: 59 | Admitting: Nurse Practitioner

## 2022-01-11 ENCOUNTER — Ambulatory Visit: Payer: 59 | Admitting: Neurology

## 2022-01-17 ENCOUNTER — Encounter: Payer: 59 | Admitting: Nurse Practitioner

## 2022-02-14 ENCOUNTER — Encounter: Payer: 59 | Admitting: Nurse Practitioner

## 2022-02-21 LAB — HM PAP SMEAR

## 2022-02-23 ENCOUNTER — Encounter: Payer: 59 | Admitting: Nurse Practitioner

## 2022-03-14 IMAGING — XA DG SPINAL PUNCT LUMBAR DIAG WITH FL CT GUIDANCE
1 series · 4 of 4 positions shown · non-contrast
Comparison: None.

CLINICAL DATA: Optic nerve edema.  Headaches.

EXAM:
DIAGNOSTIC LUMBAR PUNCTURE UNDER FLUOROSCOPIC GUIDANCE

[Series 2: ortho standard · 2 acquisitions, 4 frames shown]
[im 1/2]
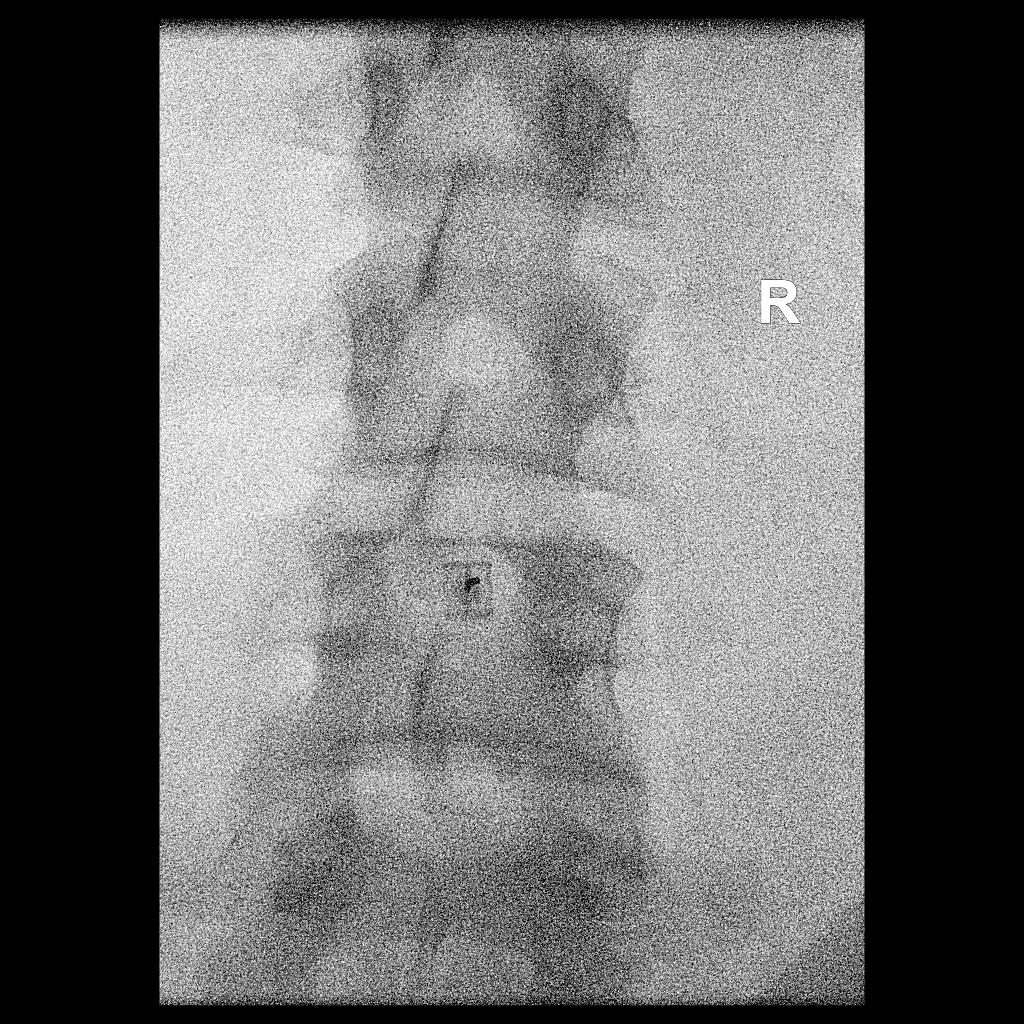
[im 1/2]
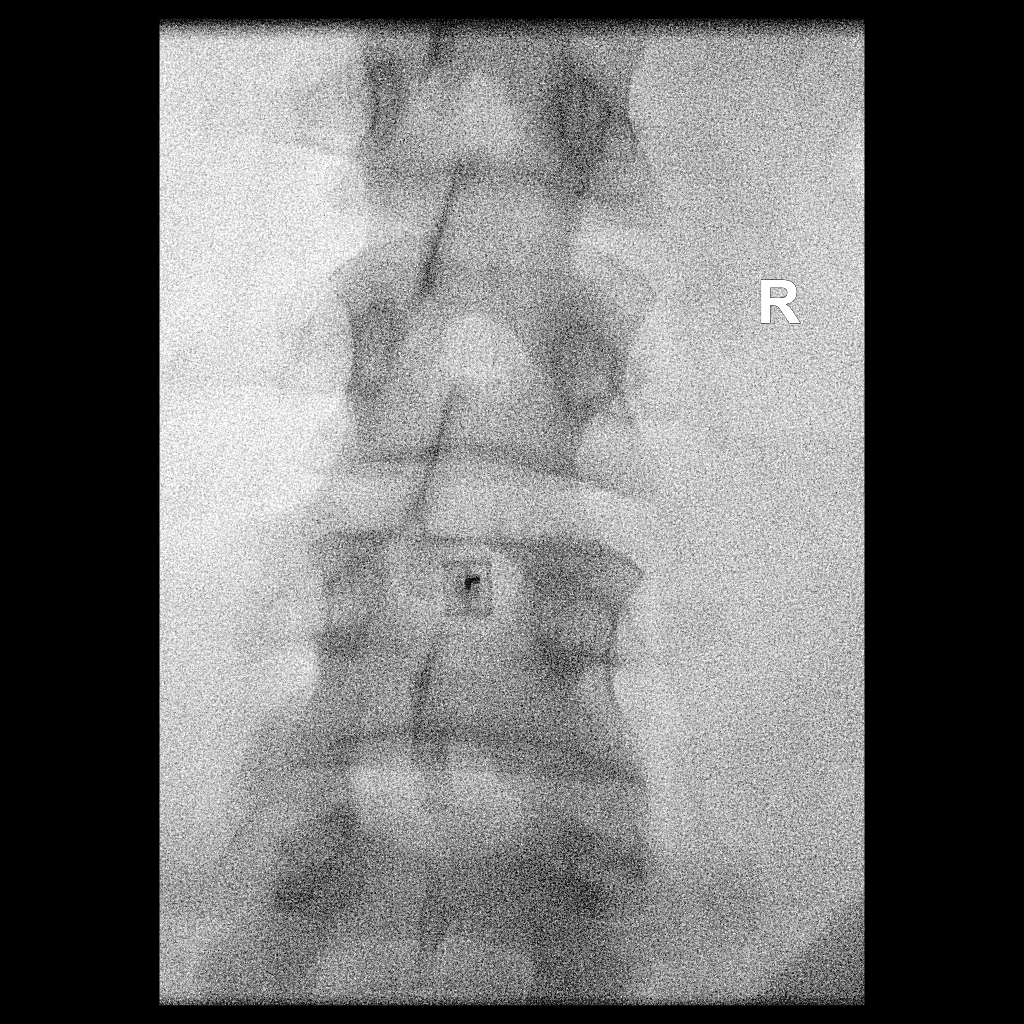
[im 1/2]
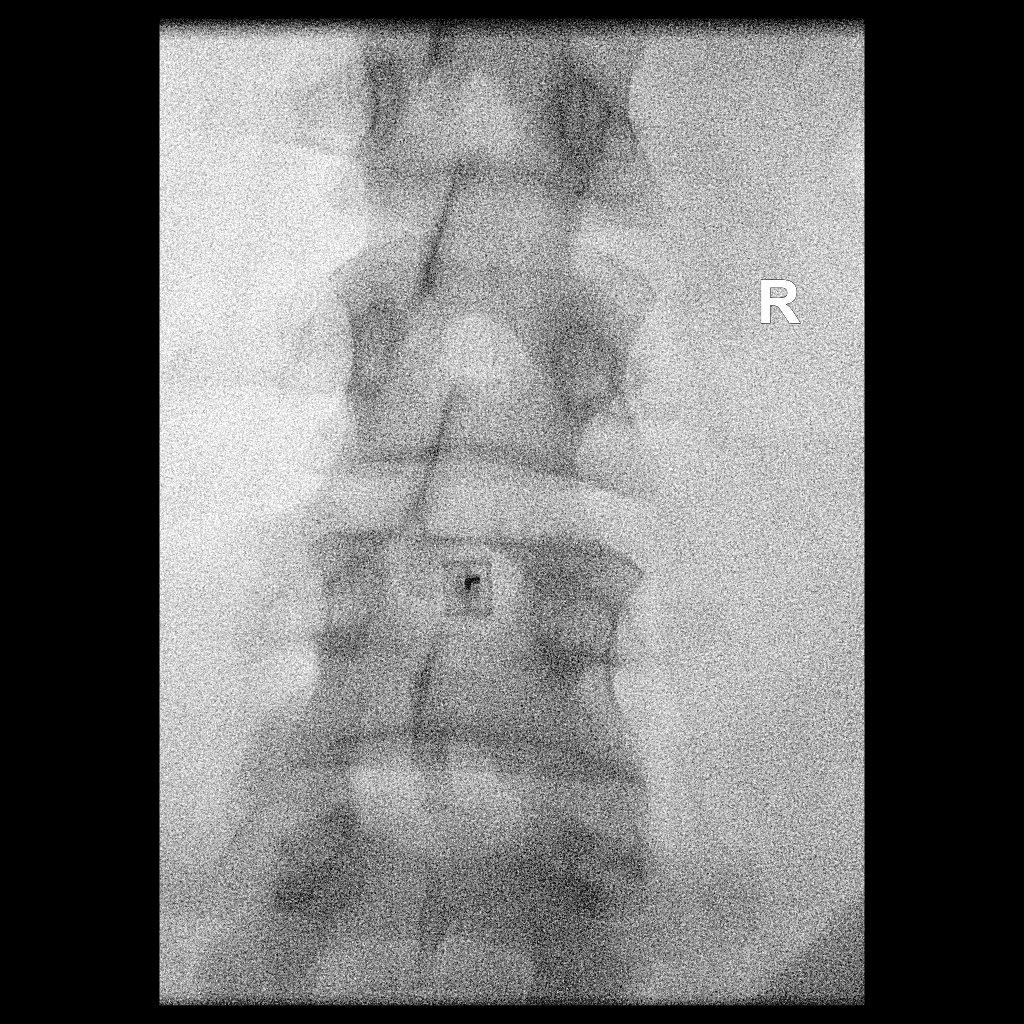
[im 2/2]
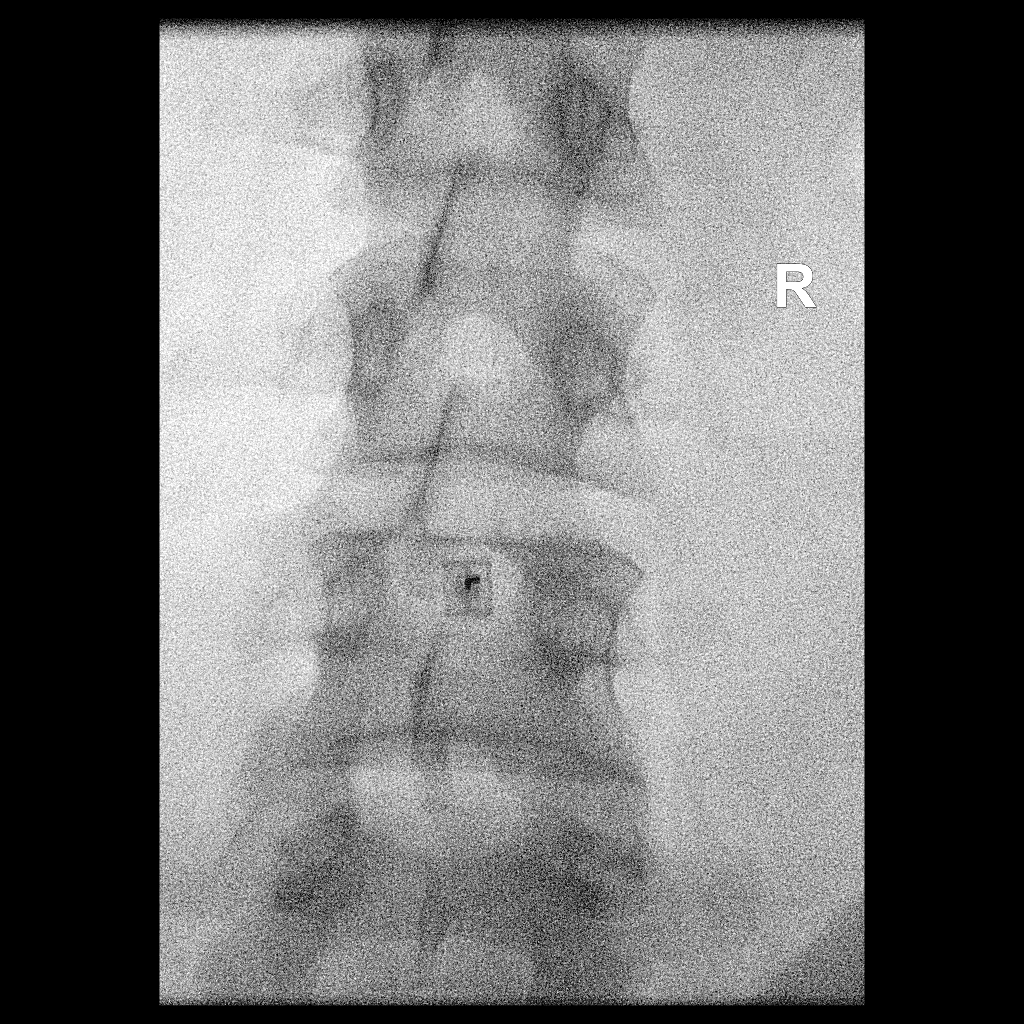

[4 of 4 positions shown; findings below may reference images not displayed]

FLUOROSCOPY:
Fluoroscopy Time:  5 seconds

Radiation Exposure Index (if provided by the fluoroscopic device):
0.40 mGy

Number of Acquired Spot Images: 0

PROCEDURE:
Informed consent was obtained from the patient prior to the
procedure, including potential complications of headache, allergy,
and pain. With the patient prone, the lower back was prepped with
Betadine. 1% Lidocaine was used for local anesthesia. Lumbar
puncture was performed at the L3-4 level using a 20 gauge needle via
a right interlaminar approach with return of clear CSF with an
opening pressure of 18 cm water (measured in the left lateral
decubitus position). A 6 inch needle was used, however a 3.5 inch
needle would have been adequate. 8 mL of CSF were obtained for
laboratory studies. Closing pressure was 12 cm water. The patient
tolerated the procedure well and there were no apparent
complications.
IMPRESSION: Technically successful fluoroscopically guided lumbar puncture.
Opening pressure of 18 cm water.

## 2022-04-06 ENCOUNTER — Telehealth: Payer: 59 | Admitting: Physician Assistant

## 2022-04-06 DIAGNOSIS — J4521 Mild intermittent asthma with (acute) exacerbation: Secondary | ICD-10-CM | POA: Diagnosis not present

## 2022-04-06 MED ORDER — ALBUTEROL SULFATE HFA 108 (90 BASE) MCG/ACT IN AERS: 2.0000 | INHALATION_SPRAY | Freq: Four times a day (QID) | RESPIRATORY_TRACT | 0 refills | Status: AC | PRN

## 2022-04-06 MED ORDER — PREDNISONE 20 MG PO TABS: 40.0000 mg | ORAL_TABLET | Freq: Every day | ORAL | 0 refills | Status: DC

## 2022-04-06 NOTE — Progress Notes (Signed)
Visit for Asthma  Based on what you have shared with me, it looks like you may have a flare up of your asthma.  Asthma is a chronic (ongoing) lung disease which results in airway obstruction, inflammation and hyper-responsiveness.   Asthma symptoms vary from person to person, with common symptoms including nighttime awakening and decreased ability to participate in normal activities as a result of shortness of breath. It is often triggered by changes in weather, changes in the season, changes in air temperature, or inside (home, school, daycare or work) allergens such as animal dander, mold, mildew, woodstoves or cockroaches.   It can also be triggered by hormonal changes, extreme emotion, physical exertion or an upper respiratory tract illness.     It is important to identify the trigger, and then eliminate or avoid the trigger if possible.   If you have been prescribed medications to be taken on a regular basis, it is important to follow the asthma action plan and to follow guidelines to adjust medication in response to increasing symptoms of decreased peak expiratory flow rate  Treatment: I have prescribed: Albuterol (Proventil HFA; Ventolin HFA) 108 (90 Base) MCG/ACT Inhaler 2 puffs into the lungs every six hours as needed for wheezing or shortness of breath and Prednisone 40mg by mouth per day for 5 - 7 days  HOME CARE Only take medications as instructed by your medical team. Consider wearing a mask or scarf to improve breathing air temperature have been shown to decrease irritation and decrease exacerbations Get rest. Taking a steamy shower or using a humidifier may help nasal congestion sand ease sore throat pain. You can place a towel over your head and breathe in the steam from hot water coming from a faucet. Using a saline nasal spray works much the same way.  Cough drops, hare  candies and sore throat lozenges may ease your cough.  Avoid close contacts especially the very you and the elderly Cover your mouth if you cough or sneeze Always remember to wash your hands.    GET HELP RIGHT AWAY IF: You develop worsening symptoms; breathlessness at rest, drowsy, confused or agitated, unable to speak in full sentences You have coughing fits You develop a severe headache or visual changes You develop shortness of breath, difficulty breathing or start having chest pain Your symptoms persist after you have completed your treatment plan If your symptoms do not improve within 10 days  MAKE SURE YOU Understand these instructions. Will watch your condition. Will get help right away if you are not doing well or get worse.   Your e-visit answers were reviewed by a board certified advanced clinical practitioner to complete your personal care plan, Depending upon the condition, your plan could have included both over the counter or prescription medications.   Please review your pharmacy choice. Your safety is important to us. If you have drug allergies check your prescription carefully.  You can use MyChart to ask questions about today's visit, request a non-urgent  call back, or ask for a work or school excuse for 24 hours related to this e-Visit. If it has been greater than 24 hours you will need to follow up with your provider, or enter a new e-Visit to address those concerns.   You will get an e-mail in the next two days asking about your experience. I hope that your e-visit has been valuable and will speed your recovery. Thank you for using e-visits.  

## 2022-04-06 NOTE — Progress Notes (Signed)
I have spent 5 minutes in review of e-visit questionnaire, review and updating patient chart, medical decision making and response to patient.   Charl Wellen Cody Raylea Adcox, PA-C    

## 2022-07-16 ENCOUNTER — Telehealth: Payer: 59 | Admitting: Family

## 2022-07-16 DIAGNOSIS — M545 Low back pain, unspecified: Secondary | ICD-10-CM

## 2022-07-16 MED ORDER — PREDNISONE 10 MG (21) PO TBPK: ORAL_TABLET | ORAL | 0 refills | Status: DC

## 2022-07-16 MED ORDER — NAPROXEN 500 MG PO TABS: 500.0000 mg | ORAL_TABLET | Freq: Two times a day (BID) | ORAL | 0 refills | Status: DC

## 2022-07-16 MED ORDER — BACLOFEN 10 MG PO TABS: 10.0000 mg | ORAL_TABLET | Freq: Three times a day (TID) | ORAL | 0 refills | Status: DC

## 2022-07-16 NOTE — Progress Notes (Signed)

## 2022-07-26 ENCOUNTER — Ambulatory Visit: Payer: 59 | Admitting: Nurse Practitioner

## 2022-07-26 ENCOUNTER — Encounter: Payer: Self-pay | Admitting: Nurse Practitioner

## 2022-07-26 VITALS — BP 130/88 | HR 88 | Temp 98.8°F | Wt 192.1 lb

## 2022-07-26 DIAGNOSIS — M549 Dorsalgia, unspecified: Secondary | ICD-10-CM | POA: Diagnosis not present

## 2022-07-26 MED ORDER — CYCLOBENZAPRINE HCL 5 MG PO TABS: 5.0000 mg | ORAL_TABLET | Freq: Three times a day (TID) | ORAL | 1 refills | Status: DC | PRN

## 2022-07-26 MED ORDER — NAPROXEN 500 MG PO TABS: 500.0000 mg | ORAL_TABLET | Freq: Two times a day (BID) | ORAL | 0 refills | Status: DC

## 2022-07-26 NOTE — Assessment & Plan Note (Signed)
Ongoing. Was seen on 11/5 virtually for acute back pain, reported relief from previous prescriptions.  Will write for flexeril and naproxen. Pt to f/u in 1 month or sooner if back pain does not improve.  Suggested resting and careful lifting as much as she can.

## 2022-07-26 NOTE — Progress Notes (Unsigned)
BP 130/88   Pulse 88   Temp 98.8 F (37.1 C) (Oral)   Wt 192 lb 1.6 oz (87.1 kg)   SpO2 98%   Breastfeeding No   BMI 31.29 kg/m    Subjective:    Patient ID: Dana Lloyd, female    DOB: 10-17-1990, 30 y.o.   MRN: [id number]  HPI: Dana Lloyd is a 31 y.o. female  NOTE WRITTEN BY DNP STUDENT.  ASSESSMENT AND PLAN OF CARE REVIEWED WITH STUDENT, AGREE WITH ABOVE FINDINGS AND PLAN.  Chief Complaint  Patient presents with   Back Pain    Patient states she was rx medication through e-visit, started noticing pain going across the chest. Denies chest pain,SOB, N/V/D, hives, blurry vision, HA.    Seen through virtual appt, no video with another [facility] provider on 11/5 for acute back pain, which she was experiencing the pain for 2 days prior. The pain was her upper back, radiating from one shoulder to the other. Was prescribed baclofen, naproxen, and prednisone.  Had some significant insomnia with the steroid, so she stopped taking that. 3 days ago, 11/12, noticed more mid sternal pain, stabbing, no change with inspiration/expiration. Today, pain level is 3/10.   Completed baclofen, stopped the naproxen as well.   Has not attempted any other pain relief medications.   Does report increased anxiety at the time of back pain/chest pain. Pt has a 48lb 29 year old boy with special needs that she picks up and carries often, potential source of her original back pain.  Denies numbness or tingling in either arm.     Denies any illness, coughing prior to onset of back pain.    Relevant past medical, surgical, family and social history reviewed and updated as indicated. Interim medical history since our last visit reviewed. Allergies and medications reviewed and updated.  Review of Systems  Constitutional:  Positive for activity change. Negative for fatigue and fever.  Respiratory:  Positive for chest tightness. Negative for cough, choking and shortness of breath.   Cardiovascular:  Negative for  chest pain and leg swelling.  Musculoskeletal:  Positive for back pain. Negative for neck pain.  Neurological:  Negative for dizziness, light-headedness, numbness and headaches.  Psychiatric/Behavioral:  The patient is nervous/anxious.     Per HPI unless specifically indicated above     Objective:    BP 130/88   Pulse 88   Temp 98.8 F (37.1 C) (Oral)   Wt 192 lb 1.6 oz (87.1 kg)   SpO2 98%   Breastfeeding No   BMI 31.29 kg/m   Wt Readings from Last 3 Encounters:  07/26/22 192 lb 1.6 oz (87.1 kg)  11/18/21 200 lb (90.7 kg)  10/03/21 195 lb (88.5 kg)    Physical Exam Constitutional:      General: She is not in acute distress.    Appearance: Normal appearance.  Cardiovascular:     Rate and Rhythm: Normal rate and regular rhythm.     Pulses: Normal pulses.     Heart sounds: Normal heart sounds.  Pulmonary:     Effort: Pulmonary effort is normal. No respiratory distress.     Breath sounds: Normal breath sounds. No wheezing or rhonchi.  Musculoskeletal:     Cervical back: Normal range of motion.     Thoracic back: Tenderness present.     Comments: Muscle tension noted to upper back  Neurological:     Mental Status: She is alert.     Results for orders placed  or performed during the hospital encounter of 10/20/21  Glucose, CSF  Result Value Ref Range   Glucose, CSF 58 40 - 80 mg/dL  CSF cell count with differential  Result Value Ref Range   Color, CSF COLORLESS COLORLESS   Appearance, CSF CLEAR CLEAR   RBC Count, CSF 0 0 cells/uL   WBC, CSF 0 0 - 5 cells/uL   Segmented Neutrophils-CSF CANCELED   Protein, CSF  Result Value Ref Range   Total Protein, CSF 34 15 - 45 mg/dL  VDRL, CSF  Result Value Ref Range   VDRL Quant, CSF Nonreactive Nonreactive      Assessment & Plan:   Problem List Items Addressed This Visit       Other   Acute upper back pain - Primary    Ongoing. Was seen on 11/5 virtually for acute back pain, reported relief from previous  prescriptions.  Will write for flexeril and naproxen. Pt to f/u in 1 month or sooner if back pain does not improve.  Suggested resting and careful lifting as much as she can.        Relevant Medications   cyclobenzaprine (FLEXERIL) 5 MG tablet   naproxen (NAPROSYN) 500 MG tablet     Follow up plan: Return in about 1 month (around 08/25/2022) for Physical, fasting labs.

## 2022-07-27 ENCOUNTER — Encounter: Payer: Self-pay | Admitting: Nurse Practitioner

## 2022-07-28 ENCOUNTER — Other Ambulatory Visit: Payer: Self-pay | Admitting: Family

## 2022-07-31 ENCOUNTER — Telehealth: Payer: Self-pay | Admitting: Neurology

## 2022-07-31 NOTE — Telephone Encounter (Signed)
Called pt back. She follows with [facility]. She is stable and doing well. Getting med refills from them. She will just f/u with our office as needed.

## 2022-07-31 NOTE — Telephone Encounter (Signed)
Pt is calling. Stated she needs to reschedule appointment with Dr. Epimenio Foot. Said she needs to find out if she needs to see him anymore because she was referred out to another provider.

## 2022-08-02 ENCOUNTER — Ambulatory Visit: Payer: 59 | Admitting: Neurology

## 2022-08-26 ENCOUNTER — Other Ambulatory Visit: Payer: Self-pay | Admitting: Nurse Practitioner

## 2022-08-28 NOTE — Telephone Encounter (Signed)
Requested medication (s) are due for refill today: yes  Requested medication (s) are on the active medication list: yes  Last refill:  07/26/22 #60/0  Future visit scheduled: no  Notes to clinic:  Unable to refill per protocol due to failed labs, no updated results.     Requested Prescriptions  Pending Prescriptions Disp Refills   naproxen (NAPROSYN) 500 MG tablet [Pharmacy Med Name: NAPROXEN 500MG TABLETS] 60 tablet 0    Sig: TAKE 1 TABLET(500 MG) BY MOUTH TWICE DAILY WITH A MEAL     Analgesics:  NSAIDS Failed - 08/26/2022  3:42 AM      Failed - Manual Review: Labs are only required if the patient has taken medication for more than 8 weeks.      Failed - Cr in normal range and within 360 days    No results found for: "CREATININE", "LABCREAU", "LABCREA", "POCCRE"       Failed - HGB in normal range and within 360 days    No results found for: "HGB", "HGBKUC", "HGBPOCKUC", "HGBOTHER", "TOTHGB", "HGBPLASMA"       Failed - PLT in normal range and within 360 days    No results found for: "PLT", "PLTCOUNTKUC", "LABPLAT", "POCPLA"       Failed - HCT in normal range and within 360 days    No results found for: "HCT", "HCTKUC", "SRHCT"       Failed - eGFR is 30 or above and within 360 days    No results found for: "GFRAA", "GFRNONAA", "GFR", "EGFR"       Passed - Patient is not pregnant      Passed - Valid encounter within last 12 months    Recent Outpatient Visits           1 month ago Acute upper back pain   [place], NP   9 months ago Depression, recurrent [facility])   [facility] Jon Billings, NP

## 2022-08-29 ENCOUNTER — Encounter: Payer: 59 | Admitting: Nurse Practitioner

## 2022-09-06 ENCOUNTER — Encounter: Payer: 59 | Admitting: Nurse Practitioner

## 2022-09-08 ENCOUNTER — Telehealth: Payer: 59 | Admitting: Physician Assistant

## 2022-09-08 DIAGNOSIS — B9689 Other specified bacterial agents as the cause of diseases classified elsewhere: Secondary | ICD-10-CM

## 2022-09-08 DIAGNOSIS — J019 Acute sinusitis, unspecified: Secondary | ICD-10-CM | POA: Diagnosis not present

## 2022-09-08 MED ORDER — AMOXICILLIN-POT CLAVULANATE 875-125 MG PO TABS: 1.0000 | ORAL_TABLET | Freq: Two times a day (BID) | ORAL | 0 refills | Status: DC

## 2022-09-08 NOTE — Progress Notes (Signed)

## 2022-12-27 ENCOUNTER — Encounter: Payer: Self-pay | Admitting: Neurology

## 2022-12-28 ENCOUNTER — Ambulatory Visit: Payer: 59 | Admitting: Neurology

## 2022-12-28 ENCOUNTER — Encounter: Payer: Self-pay | Admitting: Neurology

## 2023-02-13 ENCOUNTER — Telehealth: Payer: 59 | Admitting: Physician Assistant

## 2023-02-13 DIAGNOSIS — J019 Acute sinusitis, unspecified: Secondary | ICD-10-CM

## 2023-02-13 DIAGNOSIS — B9789 Other viral agents as the cause of diseases classified elsewhere: Secondary | ICD-10-CM | POA: Diagnosis not present

## 2023-02-13 MED ORDER — AZELASTINE HCL 0.1 % NA SOLN: 1.0000 | Freq: Two times a day (BID) | NASAL | 0 refills | Status: DC

## 2023-02-13 NOTE — Progress Notes (Signed)
E-Visit for Sinus Problems  We are sorry that you are not feeling well.  Here is how we plan to help!  Based on what you have shared with me it looks like you have sinusitis.  Sinusitis is inflammation and infection in the sinus cavities of the head.  Based on your presentation I believe you most likely have Acute Viral Sinusitis.This is an infection most likely caused by a virus. There is not specific treatment for viral sinusitis other than to help you with the symptoms until the infection runs its course.  You may use an oral decongestant such as Mucinex D or if you have glaucoma or high blood pressure use plain Mucinex. Saline nasal spray help and can safely be used as often as needed for congestion, I have prescribed: Azelastine nasal spray 2 sprays in each nostril twice a day  Some authorities believe that zinc sprays or the use of Echinacea may shorten the course of your symptoms.  Sinus infections are not as easily transmitted as other respiratory infection, however we still recommend that you avoid close contact with loved ones, especially the very young and elderly.  Remember to wash your hands thoroughly throughout the day as this is the number one way to prevent the spread of infection!  Home Care: Only take medications as instructed by your medical team. Do not take these medications with alcohol. A steam or ultrasonic humidifier can help congestion.  You can place a towel over your head and breathe in the steam from hot water coming from a faucet. Avoid close contacts especially the very young and the elderly. Cover your mouth when you cough or sneeze. Always remember to wash your hands.  Get Help Right Away If: You develop worsening fever or sinus pain. You develop a severe head ache or visual changes. Your symptoms persist after you have completed your treatment plan.  Make sure you Understand these instructions. Will watch your condition. Will get help right away if you are  not doing well or get worse.   Thank you for choosing an e-visit.  Your e-visit answers were reviewed by a board certified advanced clinical practitioner to complete your personal care plan. Depending upon the condition, your plan could have included both over the counter or prescription medications.  Please review your pharmacy choice. Make sure the pharmacy is open so you can pick up prescription now. If there is a problem, you may contact your provider through MyChart messaging and have the prescription routed to another pharmacy.  Your safety is important to us. If you have drug allergies check your prescription carefully.   For the next 24 hours you can use MyChart to ask questions about today's visit, request a non-urgent call back, or ask for a work or school excuse. You will get an email in the next two days asking about your experience. I hope that your e-visit has been valuable and will speed your recovery.  I have spent 5 minutes in review of e-visit questionnaire, review and updating patient chart, medical decision making and response to patient.   Netty Sullivant M Viva Gallaher, PA-C  

## 2023-03-04 ENCOUNTER — Telehealth: Payer: 59 | Admitting: Family

## 2023-03-04 DIAGNOSIS — J029 Acute pharyngitis, unspecified: Secondary | ICD-10-CM

## 2023-03-04 DIAGNOSIS — J069 Acute upper respiratory infection, unspecified: Secondary | ICD-10-CM | POA: Diagnosis not present

## 2023-03-04 MED ORDER — CETIRIZINE HCL 10 MG PO TABS: 10.0000 mg | ORAL_TABLET | Freq: Every day | ORAL | 1 refills | Status: AC

## 2023-03-04 MED ORDER — FLUTICASONE PROPIONATE 50 MCG/ACT NA SUSP: 2.0000 | Freq: Every day | NASAL | 6 refills | Status: AC

## 2023-03-04 NOTE — Progress Notes (Signed)
E-Visit for Upper Respiratory Infection  ? ?We are sorry you are not feeling well.  Here is how we plan to help! ? ?Based on what you have shared with me, it looks like you may have a viral upper respiratory infection.  Upper respiratory infections are caused by a large number of viruses; however, rhinovirus is the most common cause.  ? ?Symptoms vary from person to person, with common symptoms including sore throat, cough, fatigue or lack of energy and feeling of general discomfort.  A low-grade fever of up to 100.4 may present, but is often uncommon.  Symptoms vary however, and are closely related to a person's age or underlying illnesses.  The most common symptoms associated with an upper respiratory infection are nasal discharge or congestion, cough, sneezing, headache and pressure in the ears and face.  These symptoms usually persist for about 3 to 10 days, but can last up to 2 weeks.  It is important to know that upper respiratory infections do not cause serious illness or complications in most cases.   ? ?Upper respiratory infections can be transmitted from person to person, with the most common method of transmission being a person's hands.  The virus is able to live on the skin and can infect other persons for up to 2 hours after direct contact.  Also, these can be transmitted when someone coughs or sneezes; thus, it is important to cover the mouth to reduce this risk.  To keep the spread of the illness at bay, good hand hygiene is very important. ? ?This is an infection that is most likely caused by a virus. There are no specific treatments other than to help you with the symptoms until the infection runs its course.  We are sorry you are not feeling well.  Here is how we plan to help! ? ? ?For nasal congestion, you may use an oral decongestants such as Mucinex D or if you have glaucoma or high blood pressure use plain Mucinex.  Saline nasal spray or nasal drops can help and can safely be used as often as  needed for congestion.  For your congestion, I have prescribed Fluticasone nasal spray one spray in each nostril twice a day and zyrtec  mg.  ? ?If you do not have a history of heart disease, hypertension, diabetes or thyroid disease, prostate/bladder issues or glaucoma, you may also use Sudafed to treat nasal congestion.  It is highly recommended that you consult with a pharmacist or your primary care physician to ensure this medication is safe for you to take.    ? ?If you have a cough, you may use cough suppressants such as Delsym and Robitussin.  If you have glaucoma or high blood pressure, you can also use Coricidin HBP.   ? ?If you have a sore or scratchy throat, use a saltwater gargle- ? to ? teaspoon of salt dissolved in a 4-ounce to 8-ounce glass of warm water.  Gargle the solution for approximately 15-30 seconds and then spit.  It is important not to swallow the solution.  You can also use throat lozenges/cough drops and Chloraseptic spray to help with throat pain or discomfort.  Warm or cold liquids can also be helpful in relieving throat pain. ? ?For headache, pain or general discomfort, you can use Ibuprofen or Tylenol as directed.   ?Some authorities believe that zinc sprays or the use of Echinacea may shorten the course of your symptoms. ? ? ?HOME CARE ?Only take medications as   instructed by your medical team. ?Be sure to drink plenty of fluids. Water is fine as well as fruit juices, sodas and electrolyte beverages. You may want to stay away from caffeine or alcohol. If you are nauseated, try taking small sips of liquids. How do you know if you are getting enough fluid? Your urine should be a pale yellow or almost colorless. ?Get rest. ?Taking a steamy shower or using a humidifier may help nasal congestion and ease sore throat pain. You can place a towel over your head and breathe in the steam from hot water coming from a faucet. ?Using a saline nasal spray works much the same way. ?Cough drops, hard  candies and sore throat lozenges may ease your cough. ?Avoid close contacts especially the very young and the elderly ?Cover your mouth if you cough or sneeze ?Always remember to wash your hands.  ? ?GET HELP RIGHT AWAY IF: ?You develop worsening fever. ?If your symptoms do not improve within 10 days ?You develop yellow or green discharge from your nose over 3 days. ?You have coughing fits ?You develop a severe head ache or visual changes. ?You develop shortness of breath, difficulty breathing or start having chest pain ?Your symptoms persist after you have completed your treatment plan ? ?MAKE SURE YOU  ?Understand these instructions. ?Will watch your condition. ?Will get help right away if you are not doing well or get worse. ? ?Thank you for choosing an e-visit. ? ?Your e-visit answers were reviewed by a board certified advanced clinical practitioner to complete your personal care plan. Depending upon the condition, your plan could have included both over the counter or prescription medications. ? ?Please review your pharmacy choice. Make sure the pharmacy is open so you can pick up prescription now. If there is a problem, you may contact your provider through MyChart messaging and have the prescription routed to another pharmacy.  Your safety is important to us. If you have drug allergies check your prescription carefully.  ? ?For the next 24 hours you can use MyChart to ask questions about today's visit, request a non-urgent call back, or ask for a work or school excuse. ?You will get an email in the next two days asking about your experience. I hope that your e-visit has been valuable and will speed your recovery. ? ?Approximately 5 minutes was spent documenting and reviewing patient's chart.  ? ? ? ?

## 2023-03-19 ENCOUNTER — Ambulatory Visit
Admission: EM | Admit: 2023-03-19 | Discharge: 2023-03-19 | Disposition: A | Discharge status: Home / Self Care | Payer: 59 | Attending: Emergency Medicine | Admitting: Emergency Medicine

## 2023-03-19 ENCOUNTER — Telehealth: Payer: 59 | Admitting: Physician Assistant

## 2023-03-19 DIAGNOSIS — R350 Frequency of micturition: Secondary | ICD-10-CM

## 2023-03-19 DIAGNOSIS — R109 Unspecified abdominal pain: Secondary | ICD-10-CM

## 2023-03-19 DIAGNOSIS — R112 Nausea with vomiting, unspecified: Secondary | ICD-10-CM

## 2023-03-19 DIAGNOSIS — R300 Dysuria: Secondary | ICD-10-CM | POA: Insufficient documentation

## 2023-03-19 DIAGNOSIS — R1030 Lower abdominal pain, unspecified: Secondary | ICD-10-CM | POA: Diagnosis present

## 2023-03-19 DIAGNOSIS — Z3202 Encounter for pregnancy test, result negative: Secondary | ICD-10-CM | POA: Insufficient documentation

## 2023-03-19 DIAGNOSIS — M545 Low back pain, unspecified: Secondary | ICD-10-CM

## 2023-03-19 LAB — POCT URINALYSIS DIP (MANUAL ENTRY)
Glucose, UA: NEGATIVE mg/dL
Ketones, POC UA: NEGATIVE mg/dL — AB
Leukocytes, UA: NEGATIVE
Nitrite, UA: NEGATIVE
Protein Ur, POC: 100 mg/dL — AB
Spec Grav, UA: >=1.03 — AB (ref 1.010–1.025)
Urobilinogen, UA: 0.2 E.U./dL
pH, UA: 6 (ref 5.0–8.0)

## 2023-03-19 LAB — POCT URINE PREGNANCY: Preg Test, Ur: NEGATIVE

## 2023-03-19 NOTE — ED Triage Notes (Signed)
Patient to Urgent Care with complaints of lower back pain/ suprapubic pressure/ urinary frequency. Reports seeing light pink on the tissue post voiding. Has had some nausea/ vomiting. Denies any fevers.  Symptoms started today.

## 2023-03-19 NOTE — ED Provider Notes (Signed)
Dana Lloyd    CSN: [id number] Arrival date & time: 03/19/23  1632      History   Chief Complaint Chief Complaint  Patient presents with   Urinary Frequency    Having lower back pain, stomach pain, light pink when wiping, nausea and vomiting, and feeling to pee every 3 mind - Entered by patient    HPI Dana Lloyd is a 32 y.o. female.  Patient presents with dysuria, lower abdominal pain, urinary frequency, low back pain since this morning.  She also reports some nausea and vomiting today.  No OTC medications taken.  No fever, vaginal discharge, pelvic pain, flank pain, or other symptoms.  Her medical history includes PCOS.  The history is provided by the patient and medical records.    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 Authorizing Provider  acetaZOLAMIDE ER (DIAMOX) 500 MG capsule Take 500 mg by mouth daily.    [provider]  albuterol (VENTOLIN HFA) 108 (90 Base) MCG/ACT inhaler Inhale 2 puffs into the lungs every 6 (six) hours as needed for wheezing or shortness of breath. 04/06/22   Waldon Merl, PA-C  ALPRAZolam Prudy Feeler) 0.5 MG tablet Take 1 tablet by mouth 2 (two) times daily as needed for anxiety.    [provider]  amoxicillin-clavulanate (AUGMENTIN) 875-125 MG tablet Take 1 tablet by mouth 2 (two) times  daily. 09/08/22   Margaretann Loveless, PA-C  azelastine (ASTELIN) 0.1 % nasal spray Place 1 spray into both nostrils 2 (two) times daily. Use in each nostril as directed 02/13/23   Margaretann Loveless, PA-C  cetirizine (ZYRTEC ALLERGY) 10 MG tablet Take 1 tablet (10 mg total) by mouth daily. 03/04/23   Jannifer Rodney A, FNP  Cholecalciferol (VITAMIN D3) 50 MCG (2000 UT) capsule Take 2,000 Units by mouth daily. 05/21/22   [provider]  cyclobenzaprine (FLEXERIL) 5 MG tablet Take 1 tablet (5 mg total) by mouth 3 (three) times daily as needed for muscle spasms. 07/26/22   Larae Grooms, NP  Drospirenone (SLYND) 4 MG TABS Take 1 tablet by mouth daily. 02/22/22   [provider]  fluticasone (FLONASE) 50 MCG/ACT nasal spray Place 2 sprays into both nostrils daily. 03/04/23   Junie Spencer, FNP  naproxen (NAPROSYN) 500 MG tablet Take 1 tablet (500 mg total) by mouth 2 (two) times daily with a meal. 07/26/22   Larae Grooms, NP  omeprazole (PRILOSEC) 20 MG capsule Take 20 mg by mouth daily. 12/10/21 05/27/23  [provider]  sertraline (ZOLOFT) 50 MG tablet Take 1 tablet (50 mg total) by mouth daily. 11/18/21   Larae Grooms, NP    Family History Family History  Problem Relation Age of Onset   Hypertension Mother    Hypertension Father    Cerebral palsy Son    GER disease Son    Microcephaly Son    Epilepsy Son     Social History Social History   Tobacco Use   Smoking status: Never   Smokeless tobacco: Never  Vaping Use   Vaping Use: Never used  Substance Use Topics   Alcohol use: Not Currently   Drug use: Never     Allergies   Patient has no known allergies.   Review of Systems Review of Systems  Constitutional:  Negative for chills and fever.  Gastrointestinal:  Positive for abdominal pain, nausea and vomiting. Negative for diarrhea.  Genitourinary:  Positive for dysuria and frequency. Negative for flank pain, hematuria, pelvic pain and  vaginal discharge.  Musculoskeletal:  Positive for back pain.     Physical Exam Triage Vital Signs ED Triage Vitals  Enc Vitals Group     BP 03/19/23 1711 (!) 142/90     Pulse Rate 03/19/23 1711 90     Resp 03/19/23 1711 17     Temp 03/19/23 1711 97.7 F (36.5 C)     Temp src --      SpO2 03/19/23 1711 98 %     Weight --      Height --      Head Circumference --      Peak Flow --      Pain Score 03/19/23 1709 7     Pain Loc --      Pain Edu? --      Excl. in GC? --    No data found.  Updated Vital Signs BP (!) 142/90   Pulse 90   Temp 97.7 F (36.5 C)   Resp 17   SpO2 98%   Visual Acuity Right Eye Distance:   Left Eye Distance:   Bilateral Distance:    Right Eye Near:   Left Eye Near:    Bilateral Near:     Physical Exam Vitals and nursing note reviewed.  Constitutional:      General: She is not in acute distress.    Appearance: She is well-developed.  HENT:     Mouth/Throat:     Mouth: Mucous membranes are moist.  Cardiovascular:     Rate and Rhythm: Normal rate and regular rhythm.     Heart sounds: Normal heart sounds.  Pulmonary:     Effort: Pulmonary effort is normal. No respiratory distress.     Breath sounds: Normal breath sounds.  Abdominal:     General: Bowel sounds are normal.     Palpations: Abdomen is soft.     Tenderness: There is abdominal tenderness in the right lower quadrant and left lower quadrant. There is no right CVA tenderness, left CVA tenderness, guarding or rebound.     Comments: Mild tenderness to palpation of lower abdomen.  No guarding or rebound.  No CVAT.  Musculoskeletal:     Cervical back: Neck supple.  Skin:    General: Skin is warm and dry.  Neurological:     Mental Status: She is alert.  Psychiatric:        Mood and Affect: Mood normal.        Behavior: Behavior normal.  UC Treatments / Results  Labs (all labs ordered are listed, but only abnormal results are displayed) Labs Reviewed  POCT URINALYSIS  DIP (MANUAL ENTRY) - Abnormal; Notable for the following components:      Result Value   Color, UA Kapfer (*)    Clarity, UA cloudy (*)    Bilirubin, UA small (*)    Ketones, POC UA negative (*)    Spec Grav, UA >=1.030 (*)    Blood, UA large (*)    Protein Ur, POC =100 (*)    All other components within normal limits  URINE CULTURE  POCT URINE PREGNANCY    EKG   Radiology No results found.  Procedures Procedures (including critical care time)  Medications Ordered in UC Medications - No data to display  Initial Impression / Assessment and Plan / UC Course  I have reviewed the triage vital signs and the nursing notes.  Pertinent labs & imaging results that were available during my care of the patient were reviewed by me and considered in my medical decision making (see chart for details).    Dysuria, lower abdominal pain, negative pregnancy test.  Afebrile and vital signs are stable.  Mild generalized tenderness of lower abdomen; good bowel sounds; no rebound or guarding.  Urine culture pending.  Instructed patient to increase her water intake and to follow-up with her PCP for recheck of her hematuria.  ED precautions discussed.  Education provided on dysuria and abdominal pain.  She agrees to plan of care.  Final Clinical Impressions(s) / UC Diagnoses   Final diagnoses:  Dysuria  Lower abdominal pain  Negative pregnancy test     Discharge Instructions      A urine culture is pending.  Follow up with your primary care provider.        ED Prescriptions   None    PDMP not reviewed this encounter.   Mickie Bail, NP 03/19/23 1743

## 2023-03-19 NOTE — Discharge Instructions (Addendum)
A urine culture is pending.  Follow up with your primary care provider.

## 2023-03-19 NOTE — Progress Notes (Signed)
Because you are having back pain, stomach cramps and nausea and vomiting with urine frequency, I feel your condition warrants further evaluation and I recommend that you be seen in a face to face visit. These symptoms make this possibly a complicated UTI, and complicated UTIs are not safely treated through a virtual platform as they can progress rapidly.    NOTE: There will be NO CHARGE for this eVisit   If you are having a true medical emergency please call 911.      For an urgent face to face visit, [place] has eight urgent care centers for your convenience:   NEW!! [facility] at [facility] Get Driving Directions [phone] [address], [address] [place], [address]    [facility]gent [facility] Center at [facility] Get Driving Directions [phone] [address] [place], [place] [address]   [facility] Urgent Care Center [facility]) Get Driving Directions [phone] [address] [place], [place] [address]  [facility]gent Care Center [facility]) Get Driving Directions [phone] [address] [place],  [place]  [address]  [facility] Care Center [facility] - at [employer]  [phone] [address] [place],  [place] [address]   [facility] Urgent Care at [facility] Get Driving Directions [phone] [address] [place], [place] [address]   [facility] at [facility] Get Driving Directions  [phone] [address] [place], [place] [address]   [facility] Health Urgent Care at [facility] Directions [phone] [address] [place], [place] [address]  Your MyChart E-visit questionnaire answers were reviewed by a board certified advanced clinical practitioner to complete your personal care plan based on your specific symptoms.  Thank you for using  e-Visits.    I have spent 5 minutes in review of e-visit questionnaire, review and updating patient chart, medical decision making and response to patient.   Margaretann Loveless, PA-C

## 2023-03-20 LAB — URINE CULTURE: Culture: NO GROWTH

## 2023-10-19 ENCOUNTER — Telehealth: Payer: 59

## 2023-10-19 DIAGNOSIS — B9789 Other viral agents as the cause of diseases classified elsewhere: Secondary | ICD-10-CM

## 2023-10-19 DIAGNOSIS — J019 Acute sinusitis, unspecified: Secondary | ICD-10-CM

## 2023-10-20 MED ORDER — AZELASTINE HCL 0.1 % NA SOLN: 1.0000 | Freq: Two times a day (BID) | NASAL | 0 refills | Status: AC

## 2023-10-20 MED ORDER — PSEUDOEPH-BROMPHEN-DM 30-2-10 MG/5ML PO SYRP: 5.0000 mL | ORAL_SOLUTION | Freq: Four times a day (QID) | ORAL | 0 refills | Status: AC | PRN

## 2023-10-20 NOTE — Progress Notes (Signed)

## 2023-10-20 NOTE — Progress Notes (Signed)
 I have spent 5 minutes in review of e-visit questionnaire, review and updating patient chart, medical decision making and response to patient.   Claiborne Rigg, NP

## 2024-05-20 ENCOUNTER — Ambulatory Visit: Payer: Self-pay

## 2024-06-03 ENCOUNTER — Other Ambulatory Visit: Payer: Self-pay | Admitting: Medical Genetics

## 2024-06-07 ENCOUNTER — Other Ambulatory Visit: Payer: Self-pay | Attending: Medical Genetics

## 2024-10-15 ENCOUNTER — Other Ambulatory Visit: Payer: Self-pay | Admitting: Medical Genetics

## 2024-10-15 DIAGNOSIS — Z006 Encounter for examination for normal comparison and control in clinical research program: Secondary | ICD-10-CM
